# Patient Record
Sex: Male | Born: 2000 | Race: White | Hispanic: No | Marital: Single | Smoking: Current some day smoker
Health system: Southern US, Community
[De-identification: ages and names within clinical notes are randomized; demographics above are authoritative.]

---
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
## 2019-03-27 ENCOUNTER — Emergency Department (INDEPENDENT_AMBULATORY_CARE_PROVIDER_SITE_OTHER)
Admission: EM | Admit: 2019-03-27 | Discharge: 2019-03-27 | Disposition: A | Discharge status: Home / Self Care | Payer: BLUE CROSS/BLUE SHIELD | Source: Home / Self Care

## 2019-03-27 ENCOUNTER — Other Ambulatory Visit: Payer: Self-pay

## 2019-03-27 DIAGNOSIS — J029 Acute pharyngitis, unspecified: Secondary | ICD-10-CM

## 2019-03-27 DIAGNOSIS — R112 Nausea with vomiting, unspecified: Secondary | ICD-10-CM

## 2019-03-27 DIAGNOSIS — R6883 Chills (without fever): Secondary | ICD-10-CM

## 2019-03-27 DIAGNOSIS — Z20828 Contact with and (suspected) exposure to other viral communicable diseases: Secondary | ICD-10-CM

## 2019-03-27 DIAGNOSIS — J039 Acute tonsillitis, unspecified: Secondary | ICD-10-CM

## 2019-03-27 DIAGNOSIS — Z20822 Contact with and (suspected) exposure to covid-19: Secondary | ICD-10-CM

## 2019-03-27 LAB — POCT RAPID STREP A (OFFICE): Rapid Strep A Screen: NEGATIVE

## 2019-03-27 LAB — POCT INFLUENZA A/B
Influenza A, POC: NEGATIVE
Influenza B, POC: NEGATIVE

## 2019-03-27 MED ORDER — AMOXICILLIN 500 MG PO CAPS: 500.0000 mg | ORAL_CAPSULE | Freq: Two times a day (BID) | ORAL | 0 refills | Status: AC

## 2019-03-27 MED ORDER — ONDANSETRON 4 MG PO TBDP: ORAL_TABLET | ORAL | 0 refills | Status: AC

## 2019-03-27 NOTE — Discharge Instructions (Signed)
°  Please try to isolate yourself at home until the results of your Covid-19 test come back in 2-4 days.    In the meantime, please start the amoxicillin antibiotic as your symptoms appear to be due to strep throat ( a bacterial infection). A strep culture for more testing has been sent out for further evaluation.  You may take 500mg  acetaminophen every 4-6 hours or in combination with ibuprofen 400-600mg  every 6-8 hours as needed for pain, inflammation, and fever.  Be sure to well hydrated with clear liquids and get at least 8 hours of sleep at night, preferably more while sick.   Please follow up with family medicine in 1 week if needed.   Call 911 or go to the hospital if symptoms worsen- difficulty breathing, unable to keep down fluids or other new concerning symptoms develop.

## 2019-03-27 NOTE — ED Triage Notes (Signed)
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  Pt has been wearing PPE.  For the last 3 days patient has felt fatigue, sore throat, chills, fever, headache, and vomited this am.

## 2019-03-27 NOTE — ED Provider Notes (Signed)
[facility]    CSN: [id number] Arrival date & time: 03/27/19  1517     History   Chief Complaint Chief Complaint  Patient presents with  . Sore Throat  . Fever  . Emesis  . Headache    HPI Oscar Long is a 18 y.o. male.   HPI  Oscar Long is a 18 y.o. male presenting to UC with c/o 3 days of worsening sore throat, HA, chills, body aches, low-grade fever, nausea, and one episode of vomiting this morning. 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 He has been wearing PPE but he is concerned he may have still contracted the new virus.  Denies chest pain or SOB. He has not taken any medication for his symptoms. He notes he had been tested weekly at work and had been testing negative but was not tested this week because he was out sick.     History reviewed. No pertinent past medical history.  Patient Active Problem List   Diagnosis Date Noted  . BRONCHITIS, ACUTE 05/12/2009    History reviewed. No pertinent surgical history.     Home Medications    Prior to Admission medications   Medication Sig Start Date End Date Taking? Authorizing Provider  amoxicillin (AMOXIL) 500 MG capsule Take 1 capsule (500 mg total) by mouth 2 (two) times daily for 10 days. 03/27/19 04/06/19  Noe Gens, PA-C  ondansetron (ZOFRAN ODT) 4 MG disintegrating tablet 4mg  ODT every 4-8 hours prn nausea/vomit 03/27/19   Noe Gens, PA-C    Family History History reviewed. No pertinent family history.  Social History Social History   Tobacco Use  . Smoking status: Current Some Day Smoker  . Smokeless tobacco: Never Used  Substance Use Topics  . Alcohol use: Yes  . Drug use: Not Currently     Allergies   Patient has no known allergies.   Review of Systems Review of Systems  Constitutional: Positive for chills and fever.  HENT: Positive for sore throat. Negative for congestion, ear pain, trouble swallowing  and voice change.   Respiratory: Positive for cough. Negative for chest tightness and shortness of breath.   Cardiovascular: Negative for chest pain and palpitations.  Gastrointestinal: Positive for nausea and vomiting. Negative for abdominal pain and diarrhea.  Musculoskeletal: Negative for arthralgias, back pain and myalgias.  Skin: Negative for rash.  Neurological: Positive for headaches. Negative for dizziness and light-headedness.     Physical Exam Triage Vital Signs ED Triage Vitals  Enc Vitals Group     BP      Pulse      Resp      Temp      Temp src      SpO2      Weight      Height      Head Circumference      Peak Flow      Pain Score      Pain Loc      Pain Edu?      Excl. in [place]?    No data found.  Updated Vital Signs BP 127/83 (BP Location: Right Arm)   Pulse 96   Temp 99.4 F (37.4 C) (Oral)   Resp 20   Ht 5\' 8"  (1.727 m)   Wt 145 lb (65.8 kg)   SpO2 98%   BMI 22.05 kg/m   Visual Acuity Right Eye Distance:   Left  Eye Distance:   Bilateral Distance:    Right Eye Near:   Left Eye Near:    Bilateral Near:     Physical Exam Vitals signs and nursing note reviewed.  Constitutional:      Appearance: He is well-developed.  HENT:     Head: Normocephalic and atraumatic.     Right Ear: Tympanic membrane normal.     Left Ear: Tympanic membrane normal.     Nose: Nose normal.     Right Sinus: No maxillary sinus tenderness or frontal sinus tenderness.     Left Sinus: No maxillary sinus tenderness or frontal sinus tenderness.     Mouth/Throat:     Lips: Pink.     Mouth: Mucous membranes are moist.     Pharynx: Oropharyngeal exudate and posterior oropharyngeal erythema ( petichiae on roof of mouth and RIght side of pharynx) present. No uvula swelling.     Tonsils: Tonsillar exudate present. No tonsillar abscesses.  Neck:     Musculoskeletal: Normal range of motion and neck supple.  Cardiovascular:     Rate and Rhythm: Normal rate and regular rhythm.   Pulmonary:     Effort: Pulmonary effort is normal. No respiratory distress.     Breath sounds: Normal breath sounds. No stridor. No wheezing, rhonchi or rales.  Musculoskeletal: Normal range of motion.  Lymphadenopathy:     Cervical: Cervical adenopathy present.  Skin:    General: Skin is warm and dry.  Neurological:     Mental Status: He is alert and oriented to person, place, and time.  Psychiatric:        Behavior: Behavior normal.      UC Treatments / Results  Labs (all labs ordered are listed, but only abnormal results are displayed) Labs Reviewed  STREP A DNA PROBE  SARS-COV-2 RNA, QUALITATIVE REAL-TIME RT-PCR  POCT RAPID STREP A (OFFICE)  POCT INFLUENZA A/B    EKG None  Radiology No results found.  Procedures Procedures (including critical care time)  Medications Ordered in UC Medications - No data to display  Initial Impression / Assessment and Plan / UC Course  I have reviewed the triage vital signs and the nursing notes.  Pertinent labs & imaging results that were available during my care of the patient were reviewed by me and considered in my medical decision making (see chart for details).    Rapid flu: Negative Rapid strep: NEGATIVE, however, exam c/w strep pharyngitis. Will start pt on amoxicillin while strep culture pending.  Due to known exposure to Covid-19 and symptoms c/w the new virus, will also test for Covid-19  Home care info and work note provided. Work note provided until Tuesday of next week, 04/02/2019 to allow time for Covid results to come back. New work note can be given pending results.    Final Clinical Impressions(s) / UC Diagnoses   Final diagnoses:  Sore throat  Chills  Nausea and vomiting in adult  Exposure to Covid-19 Virus  Exudative tonsillitis     Discharge Instructions      Please try to isolate yourself at home until the results of your Covid-19 test come back in 2-4 days.    In the meantime, please start  the amoxicillin antibiotic as your symptoms appear to be due to strep throat ( a bacterial infection). A strep culture for more testing has been sent out for further evaluation.  You may take 500mg  acetaminophen every 4-6 hours or in combination with ibuprofen 400-600mg  every 6-8 hours  as needed for pain, inflammation, and fever.  Be sure to well hydrated with clear liquids and get at least 8 hours of sleep at night, preferably more while sick.   Please follow up with family medicine in 1 week if needed.   Call 911 or go to the hospital if symptoms worsen- difficulty breathing, unable to keep down fluids or other new concerning symptoms develop.     ED Prescriptions    Medication Sig Dispense Auth. Provider   amoxicillin (AMOXIL) 500 MG capsule Take 1 capsule (500 mg total) by mouth 2 (two) times daily for 10 days. 20 capsule Gerarda Fraction, [place] O, PA-C   ondansetron (ZOFRAN ODT) 4 MG disintegrating tablet 4mg  ODT every 4-8 hours prn nausea/vomit 8 tablet Noe Gens, PA-C     Controlled Substance Prescriptions [place] Controlled Substance Registry consulted? Not Applicable   Tyrell Antonio 03/27/19 8756

## 2019-03-28 LAB — STREP A DNA PROBE: Group A Strep Probe: NOT DETECTED

## 2019-03-29 LAB — SARS-COV-2 RNA,(COVID-19) QUALITATIVE NAAT: SARS CoV2 RNA: NOT DETECTED

## 2019-03-30 ENCOUNTER — Telehealth: Payer: Self-pay

## 2019-03-30 NOTE — Telephone Encounter (Signed)
VM not set up.

## 2019-03-31 NOTE — Telephone Encounter (Signed)
Pt states hes feeling better after UC visit. Given neg lab results. Requested to pick up results for work. Told to bring ID when picking up.

## 2019-11-28 ENCOUNTER — Other Ambulatory Visit: Payer: Self-pay

## 2019-11-28 ENCOUNTER — Emergency Department (INDEPENDENT_AMBULATORY_CARE_PROVIDER_SITE_OTHER)
Admission: EM | Admit: 2019-11-28 | Discharge: 2019-11-28 | Disposition: A | Discharge status: Home / Self Care | Payer: Self-pay | Source: Home / Self Care | Attending: Family Medicine | Admitting: Family Medicine

## 2019-11-28 DIAGNOSIS — S71112A Laceration without foreign body, left thigh, initial encounter: Secondary | ICD-10-CM

## 2019-11-28 NOTE — ED Triage Notes (Signed)
Skateboarding, and slid down a nail.  Happened about 1 hour ago.Has a gaping, jagged cut to the left upper thigh.

## 2019-11-28 NOTE — Discharge Instructions (Addendum)
Change dressing daily (use non-stick bandage such as Telfa) and apply Bacitracin ointment to wound.  Keep wound clean and dry.  Return for any signs of infection (or follow-up with family doctor):  Increasing redness, swelling, pain, heat, drainage, etc. Return in 10 days for suture removal.

## 2019-11-28 NOTE — ED Provider Notes (Signed)
[facility]    CSN: [id number] Arrival date & time: 11/28/19  1409      History   Chief Complaint Chief Complaint  Patient presents with  . Laceration    thigh    HPI [facility] is a 19 y.o. male.   While skateboarding over a ramp about 2 hours ago patient fell, sliding down the ramp.  A protruding nail lacerated his left thigh.  He reports that his last Tdap was within the past two years.  The history is provided by the patient.  Laceration Location: left thigh. Length:  2.5cm Depth:  Through underlying tissue Quality: avulsion   Bleeding: controlled   Time since incident:  2 hours Laceration mechanism:  Nail Pain details:    Quality:  Aching   Severity:  Mild   Timing:  Constant   Progression:  Unchanged Foreign body present:  No foreign bodies Relieved by:  None tried Worsened by:  Nothing Ineffective treatments:  None tried Tetanus status:  Up to date Associated symptoms: no swelling     History reviewed. No pertinent past medical history.  Patient Active Problem List   Diagnosis Date Noted  . BRONCHITIS, ACUTE 05/12/2009    History reviewed. No pertinent surgical history.     Home Medications    Prior to Admission medications   Medication Sig Start Date End Date Taking? Authorizing Provider  FLUoxetine (PROZAC) 40 MG capsule Take by mouth. 01/08/18  Yes [provider]  ondansetron (ZOFRAN ODT) 4 MG disintegrating tablet 4mg  ODT every 4-8 hours prn nausea/vomit 03/27/19   Noe Gens, PA-C    Family History Family History  Problem Relation Age of Onset  . Osteoporosis Mother     Social History Social History   Tobacco Use  . Smoking status: Current Some Day Smoker  . Smokeless tobacco: Never Used  Substance Use Topics  . Alcohol use: Yes  . Drug use: Not Currently     Allergies   Patient has no known allergies.   Review of Systems Review of Systems  All other systems reviewed and are  negative.    Physical Exam Triage Vital Signs ED Triage Vitals [11/28/19 1500]  Enc Vitals Group     BP 129/82     Pulse Rate 93     Resp 18     Temp 98.2 F (36.8 C)     Temp Source Oral     SpO2 98 %     Weight 154 lb (69.9 kg)     Height 5\' 7"  (1.702 m)     Head Circumference      Peak Flow      Pain Score 6     Pain Loc      Pain Edu?      Excl. in [place]?    No data found.  Updated Vital Signs BP 129/82 (BP Location: Right Arm)   Pulse 93   Temp 98.2 F (36.8 C) (Oral)   Resp 18   Ht 5\' 7"  (1.702 m)   Wt 69.9 kg   SpO2 98%   BMI 24.12 kg/m   Visual Acuity Right Eye Distance:   Left Eye Distance:   Bilateral Distance:    Right Eye Near:   Left Eye Near:    Bilateral Near:     Physical Exam Vitals and nursing note reviewed.  Constitutional:      General: He is not in acute distress. HENT:     Head: Atraumatic.  Eyes:     Pupils: Pupils are equal, round, and reactive to light.  Cardiovascular:     Rate and Rhythm: Normal rate.  Pulmonary:     Effort: Pulmonary effort is normal.  Musculoskeletal:     Left upper leg: Laceration present.       Legs:     Comments: Left lateral thigh has a 2.5cm flap laceration as noted on diagram.  Beneath and adjacent to the flap laceration is a 1.5cm diameter abrasion.  Skin:    General: Skin is warm and dry.  Neurological:     Mental Status: He is alert.      UC Treatments / Results  Labs (all labs ordered are listed, but only abnormal results are displayed) Labs Reviewed - No data to display  EKG   Radiology No results found.  Procedures Procedures  Laceration Repair Discussed benefits and risks of procedure and verbal consent obtained. Using sterile technique and local anesthesia with 1% lidocaine with epinephrine, cleansed wound with Betadine followed by copious lavage with normal saline.  Wound carefully inspected for debris and foreign bodies; none found.  Wound closed with #6, 4-0 interrupted  nylon sutures.  Bacitracin applied. Wound dressed with Mepelex Border (7.5cm) bandage and taped in place.  Wound precautions explained to patient.  Return for suture removal in 10 days.   Medications Ordered in UC Medications - No data to display  Initial Impression / Assessment and Plan / UC Course  I have reviewed the triage vital signs and the nursing notes.  Pertinent labs & imaging results that were available during my care of the patient were reviewed by me and considered in my medical decision making (see chart for details).    Patient advised to remove the Mepelex dressing in 24 hours, and then begin using Telfa dressings daily.  Final Clinical Impressions(s) / UC Diagnoses   Final diagnoses:  Laceration of skin of left thigh, initial encounter     Discharge Instructions     Change dressing daily (use non-stick bandage such as Telfa) and apply Bacitracin ointment to wound.  Keep wound clean and dry.  Return for any signs of infection (or follow-up with family doctor):  Increasing redness, swelling, pain, heat, drainage, etc. Return in 10 days for suture removal.      ED Prescriptions    None     PDMP not reviewed this encounter.   Kandra Nicolas, MD 11/29/19 [phone]

## 2020-02-12 DIAGNOSIS — F1220 Cannabis dependence, uncomplicated: Secondary | ICD-10-CM | POA: Insufficient documentation

## 2020-02-18 ENCOUNTER — Telehealth (HOSPITAL_COMMUNITY): Payer: Self-pay | Admitting: Professional

## 2020-02-18 ENCOUNTER — Other Ambulatory Visit: Payer: Self-pay

## 2020-02-18 ENCOUNTER — Other Ambulatory Visit (HOSPITAL_COMMUNITY): Payer: BC Managed Care – PPO | Attending: Psychiatry

## 2020-09-21 ENCOUNTER — Emergency Department (INDEPENDENT_AMBULATORY_CARE_PROVIDER_SITE_OTHER): Payer: Self-pay

## 2020-09-21 ENCOUNTER — Encounter: Payer: Self-pay | Admitting: Emergency Medicine

## 2020-09-21 ENCOUNTER — Emergency Department (INDEPENDENT_AMBULATORY_CARE_PROVIDER_SITE_OTHER)
Admission: EM | Admit: 2020-09-21 | Discharge: 2020-09-21 | Disposition: A | Discharge status: Home / Self Care | Payer: Self-pay | Source: Home / Self Care

## 2020-09-21 DIAGNOSIS — M25471 Effusion, right ankle: Secondary | ICD-10-CM

## 2020-09-21 DIAGNOSIS — S93401A Sprain of unspecified ligament of right ankle, initial encounter: Secondary | ICD-10-CM

## 2020-09-21 DIAGNOSIS — G8929 Other chronic pain: Secondary | ICD-10-CM

## 2020-09-21 DIAGNOSIS — M25571 Pain in right ankle and joints of right foot: Secondary | ICD-10-CM

## 2020-09-21 DIAGNOSIS — R29898 Other symptoms and signs involving the musculoskeletal system: Secondary | ICD-10-CM

## 2020-09-21 NOTE — ED Triage Notes (Signed)
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

## 2020-09-21 NOTE — Discharge Instructions (Addendum)
  You may use the splint for comfort and support. Call to schedule a follow up appointment with Sports Medicine. You may benefit from physical therapy to help strengthen your ankle.

## 2020-09-21 NOTE — ED Provider Notes (Signed)
[facility]    CSN: [id number] Arrival date & time: 09/21/20  1417      History   Chief Complaint Chief Complaint  Patient presents with  . Ankle Injury    HPI Oscar Long is a 19 y.o. male.   HPI Oscar Long is a 19 y.o. male presenting to UC with c/o Right ankle pain and swelling that started 3 days ago after rolling his ankle skateboarding. Hx of Right ankle fracture and surgery in the past but states no hardware was placed.  Ankle stays swollen.  Pain is 4/10, worse with ambulation.    History reviewed. No pertinent past medical history.  Patient Active Problem List   Diagnosis Date Noted  . Cannabis use disorder, severe, dependence ([place]) 02/12/2020  . Major depressive disorder with single episode, in full remission ([place]) 10/05/2018  . Attention deficit hyperactivity disorder (ADHD) 07/19/2018  . Suicide attempt by inadequate means ([place]) 11/27/2017  . Osteochondromatosis 01/31/2016  . Eczema 07/26/2011  . BRONCHITIS, ACUTE 05/12/2009    History reviewed. No pertinent surgical history.     Home Medications    Prior to Admission medications   Medication Sig Start Date End Date Taking? Authorizing Provider  FLUoxetine (PROZAC) 40 MG capsule Take by mouth. Patient not taking: Reported on 09/21/2020 01/08/18   [provider]  ondansetron (ZOFRAN ODT) 4 MG disintegrating tablet 4mg  ODT every 4-8 hours prn nausea/vomit Patient not taking: Reported on 09/21/2020 03/27/19   Noe Gens, PA-C    Family History Family History  Problem Relation Age of Onset  . Osteoporosis Mother   . Healthy Father   . Healthy Sister   . Healthy Brother   . Healthy Sister     Social History Social History   Tobacco Use  . Smoking status: Current Some Day Smoker  . Smokeless tobacco: Never Used  Vaping Use  . Vaping Use: Every day  . Substances: Nicotine  Substance Use Topics  . Alcohol use: Not Currently  . Drug use: Yes    Frequency: 1.0 times  per week    Types: Marijuana     Allergies   Diphenhydramine hcl   Review of Systems Review of Systems  Musculoskeletal: Positive for arthralgias and joint swelling.  Skin: Negative for color change and wound.     Physical Exam Triage Vital Signs ED Triage Vitals  Enc Vitals Group     BP 09/21/20 1436 125/68     Pulse Rate 09/21/20 1436 61     Resp 09/21/20 1436 15     Temp 09/21/20 1436 98.1 F (36.7 C)     Temp Source 09/21/20 1436 Oral     SpO2 09/21/20 1436 99 %     Weight 09/21/20 1443 153 lb (69.4 kg)     Height 09/21/20 1443 5\' 7"  (1.702 m)     Head Circumference --      Peak Flow --      Pain Score 09/21/20 1442 4     Pain Loc --      Pain Edu? --      Excl. in [place]? --    No data found.  Updated Vital Signs BP 125/68 (BP Location: Right Arm)   Pulse 61   Temp 98.1 F (36.7 C) (Oral)   Resp 15   Ht 5\' 7"  (1.702 m)   Wt 153 lb (69.4 kg)   SpO2 99%   BMI 23.96 kg/m   Visual Acuity Right Eye Distance:  Left Eye Distance:   Bilateral Distance:    Right Eye Near:   Left Eye Near:    Bilateral Near:     Physical Exam Vitals and nursing note reviewed.  Constitutional:      Appearance: Normal appearance. He is well-developed.  HENT:     Head: Normocephalic and atraumatic.  Cardiovascular:     Rate and Rhythm: Normal rate and regular rhythm.     Pulses:          Dorsalis pedis pulses are 2+ on the right side.       Posterior tibial pulses are 2+ on the right side.  Pulmonary:     Effort: Pulmonary effort is normal.  Musculoskeletal:        General: Swelling and tenderness present. Normal range of motion.     Cervical back: Normal range of motion.     Comments: Right ankle: moderate edema. Tenderness to medial aspect. Full ROM, increased pain with movement.  No tenderness to foot or calf.   Skin:    General: Skin is warm and dry.     Capillary Refill: Capillary refill takes less than 2 seconds.     Findings: No bruising or erythema.   Neurological:     Mental Status: He is alert and oriented to person, place, and time.  Psychiatric:        Behavior: Behavior normal.      UC Treatments / Results  Labs (all labs ordered are listed, but only abnormal results are displayed) Labs Reviewed - No data to display  EKG   Radiology DG Ankle Complete Right  Result Date: 09/21/2020 CLINICAL DATA:  Chronic right ankle pain and swelling after injury 3 days ago. History states right ankle sx 3 years ago. EXAM: RIGHT ANKLE - COMPLETE 3+ VIEW COMPARISON:  None. FINDINGS: Examination demonstrates moderate bony remodeling over the distal tibia and fibular diametaphyseal regions likely due to old fracture sites. Ankle mortise is within normal. No acute fracture or dislocation. IMPRESSION: 1. No acute findings. 2. Moderate bony remodeling over the distal tibia and fibula likely due to old fracture sites. Electronically Signed   By: Marin Olp M.D.   On: 09/21/2020 15:33    Procedures Procedures (including critical care time)  Medications Ordered in UC Medications - No data to display  Initial Impression / Assessment and Plan / UC Course  I have reviewed the triage vital signs and the nursing notes.  Pertinent labs & imaging results that were available during my care of the patient were reviewed by me and considered in my medical decision making (see chart for details).    Discussed imaging with pt Ankle splint provided for comfort and support F/u with Sports Medicine AVS given  Final Clinical Impressions(s) / UC Diagnoses   Final diagnoses:  Pain and swelling of right ankle  Chronic pain of right ankle  Ankle weakness  Sprain of right ankle, unspecified ligament, initial encounter     Discharge Instructions      You may use the splint for comfort and support. Call to schedule a follow up appointment with Sports Medicine. You may benefit from physical therapy to help strengthen your ankle.     ED  Prescriptions    None     PDMP not reviewed this encounter.   Noe Gens, [place] 09/21/20 1702

## 2022-02-23 IMAGING — DX DG ANKLE COMPLETE 3+V*R*
3 series · 3 of 3 positions shown · non-contrast
Comparison: None.

CLINICAL DATA: Chronic right ankle pain and swelling after injury 3
days ago. History states right ankle sx 3 years ago.

EXAM:
RIGHT ANKLE - COMPLETE 3+ VIEW

[ankle ap]
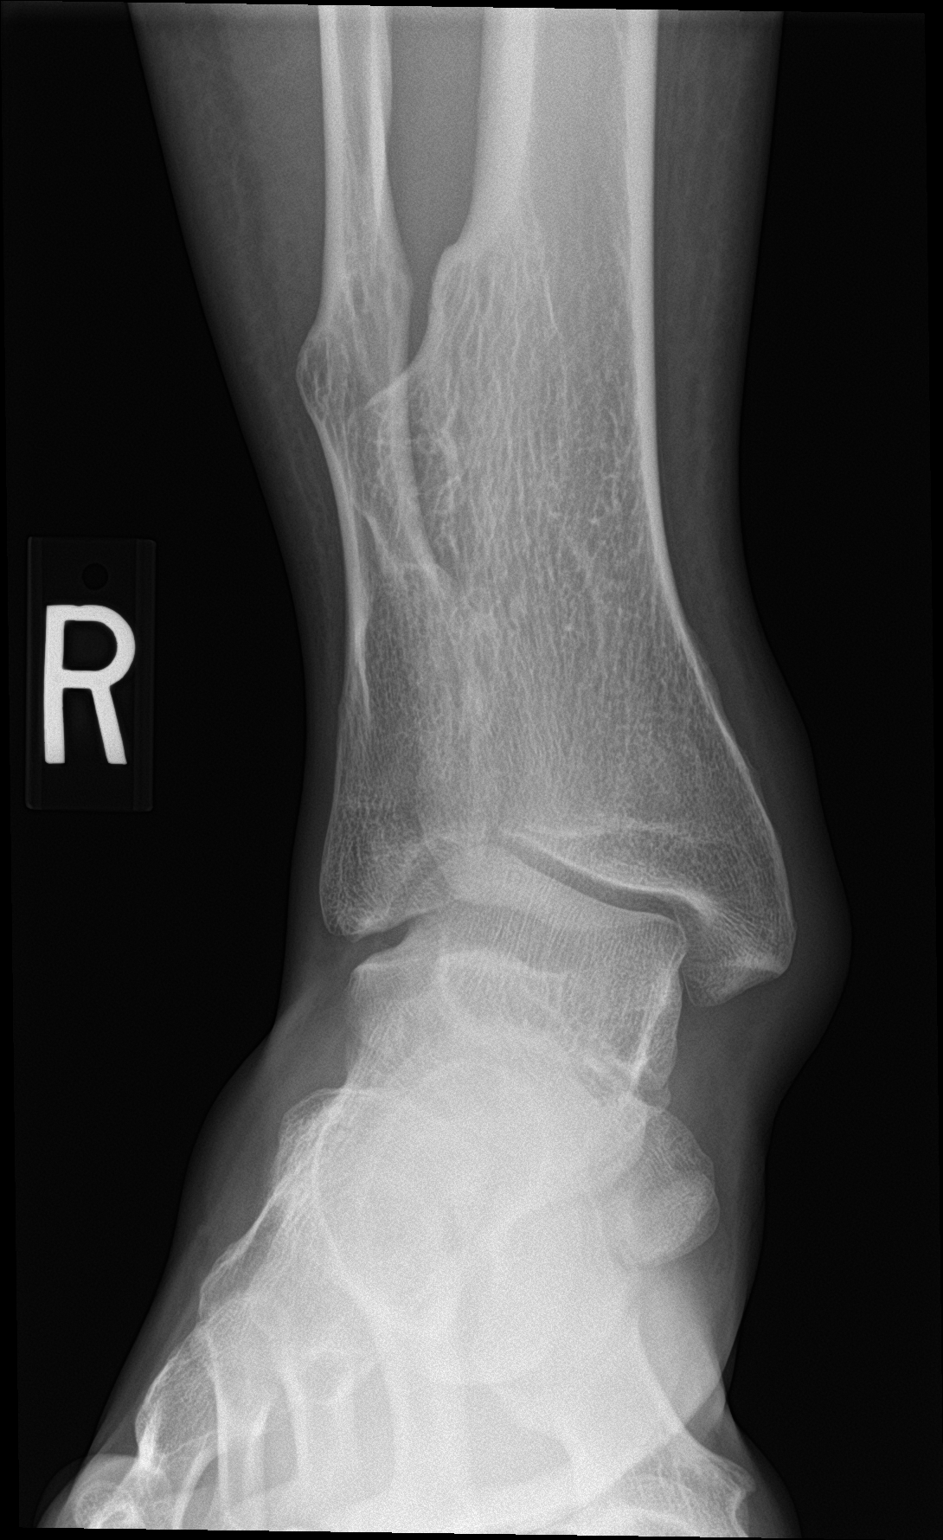

[ankle obl]
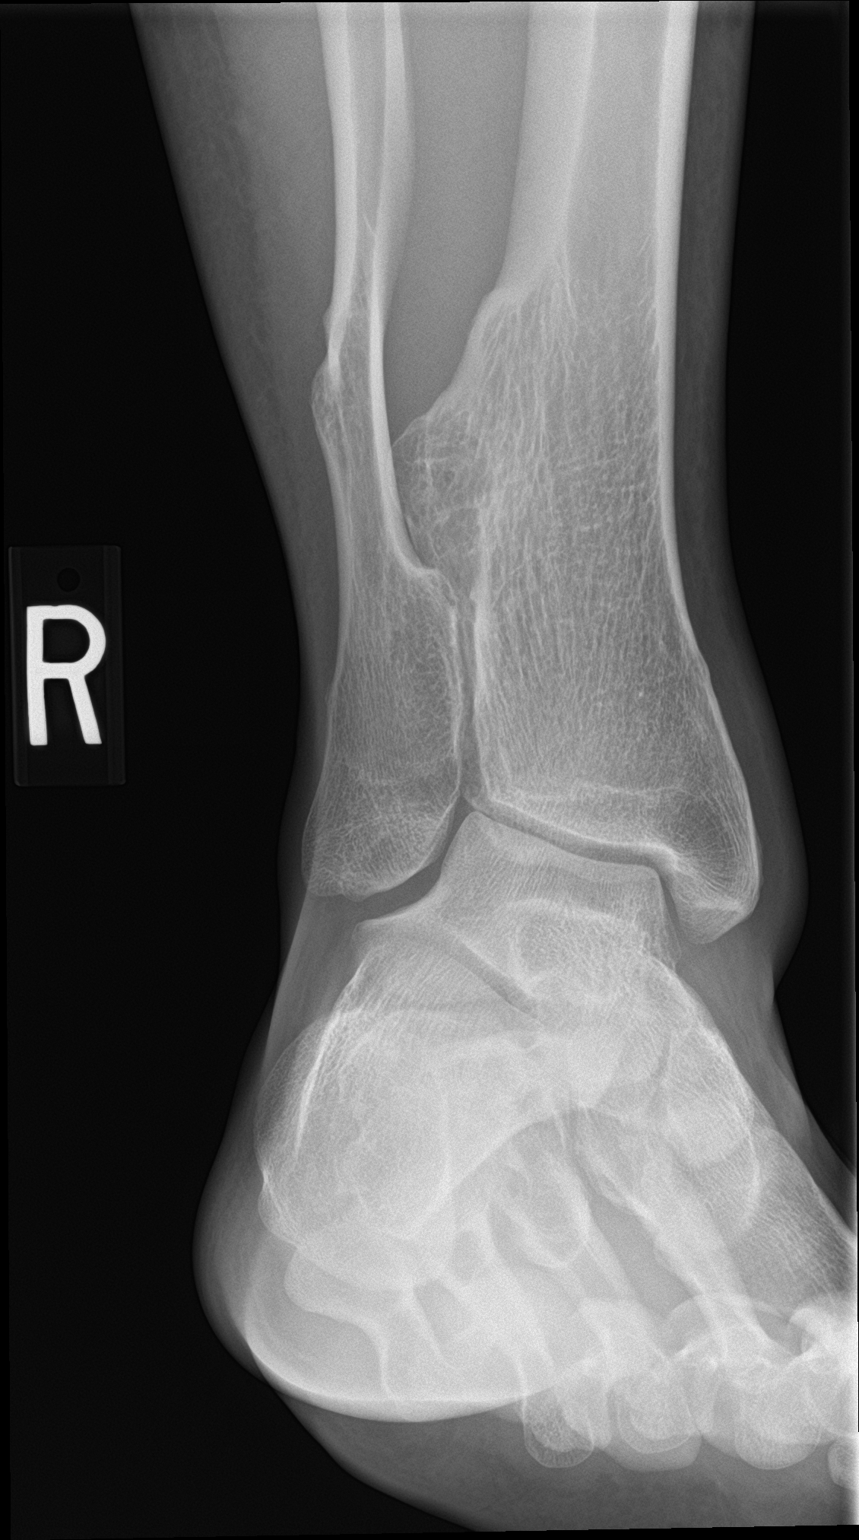

[ankle lat]
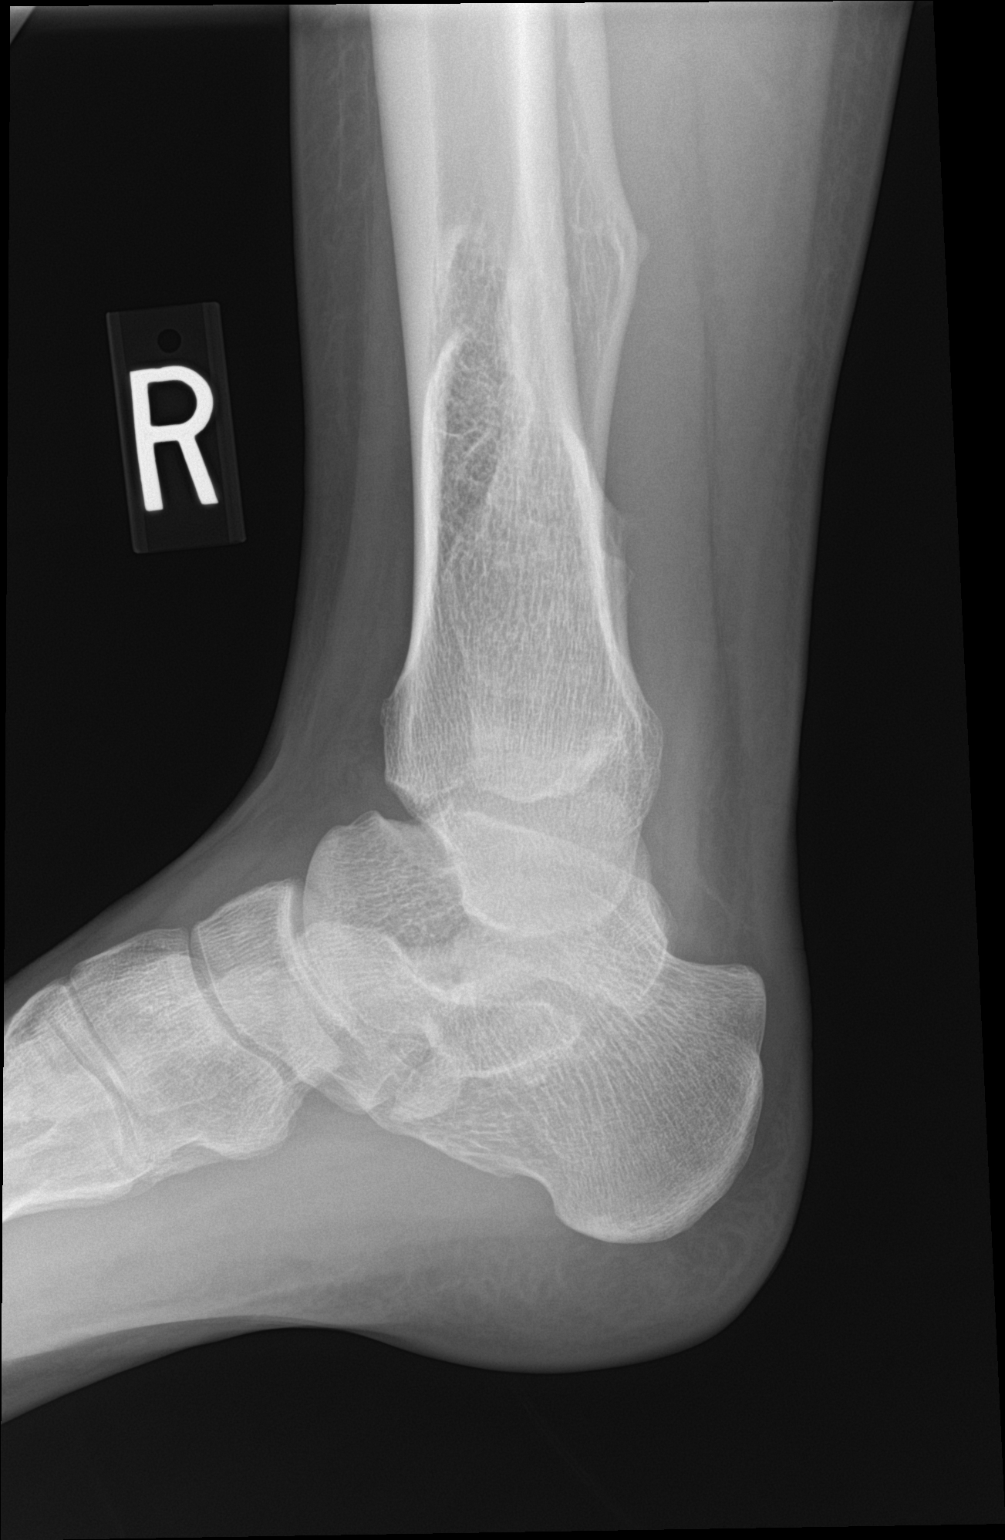

[3 of 3 positions shown; findings below may reference images not displayed]

FINDINGS: Examination demonstrates moderate bony remodeling over the distal
tibia and fibular diametaphyseal regions likely due to old fracture
sites. Ankle mortise is within normal. No acute fracture or
dislocation.
IMPRESSION: 1. No acute findings.
2. Moderate bony remodeling over the distal tibia and fibula likely
due to old fracture sites.

## 2023-07-28 ENCOUNTER — Encounter (HOSPITAL_COMMUNITY): Payer: Self-pay

## 2023-07-28 ENCOUNTER — Ambulatory Visit (HOSPITAL_COMMUNITY)
Admission: EM | Admit: 2023-07-28 | Discharge: 2023-07-28 | Disposition: A | Discharge status: Home / Self Care | Payer: BC Managed Care – PPO | Attending: Physician Assistant | Admitting: Physician Assistant

## 2023-07-28 DIAGNOSIS — Z203 Contact with and (suspected) exposure to rabies: Secondary | ICD-10-CM

## 2023-07-28 DIAGNOSIS — Z2914 Encounter for prophylactic rabies immune globin: Secondary | ICD-10-CM | POA: Diagnosis not present

## 2023-07-28 DIAGNOSIS — S81802A Unspecified open wound, left lower leg, initial encounter: Secondary | ICD-10-CM | POA: Diagnosis not present

## 2023-07-28 DIAGNOSIS — W540XXA Bitten by dog, initial encounter: Secondary | ICD-10-CM

## 2023-07-28 MED ORDER — TETANUS-DIPHTH-ACELL PERTUSSIS 5-2.5-18.5 LF-MCG/0.5 IM SUSY
PREFILLED_SYRINGE | INTRAMUSCULAR | Status: AC
  Filled 2023-07-28: qty 0.5

## 2023-07-28 MED ORDER — IBUPROFEN 800 MG PO TABS
800.0000 mg | ORAL_TABLET | Freq: Three times a day (TID) | ORAL | 0 refills | Status: AC
Start: 2023-07-28 — End: ?

## 2023-07-28 MED ORDER — RABIES IMMUNE GLOBULIN 150 UNIT/ML IM INJ
20.0000 [IU]/kg | INJECTION | Freq: Once | INTRAMUSCULAR | Status: AC
  Administered 2023-07-28: 1350 [IU]

## 2023-07-28 MED ORDER — RABIES IMMUNE GLOBULIN 150 UNIT/ML IM INJ
INJECTION | INTRAMUSCULAR | Status: AC
  Filled 2023-07-28: qty 10

## 2023-07-28 MED ORDER — MUPIROCIN 2 % EX OINT: 1.0000 | TOPICAL_OINTMENT | Freq: Two times a day (BID) | CUTANEOUS | 0 refills | Status: AC

## 2023-07-28 MED ORDER — TETANUS-DIPHTH-ACELL PERTUSSIS 5-2.5-18.5 LF-MCG/0.5 IM SUSY
0.5000 mL | PREFILLED_SYRINGE | Freq: Once | INTRAMUSCULAR | Status: AC
  Administered 2023-07-28: 0.5 mL via INTRAMUSCULAR

## 2023-07-28 MED ORDER — RABIES VACCINE, PCEC IM SUSR
INTRAMUSCULAR | Status: AC
  Filled 2023-07-28: qty 1

## 2023-07-28 MED ORDER — RABIES VACCINE, PCEC IM SUSR
1.0000 mL | Freq: Once | INTRAMUSCULAR | Status: AC
  Administered 2023-07-28: 1 mL via INTRAMUSCULAR

## 2023-07-28 MED ORDER — AMOXICILLIN-POT CLAVULANATE 875-125 MG PO TABS
1.0000 | ORAL_TABLET | Freq: Two times a day (BID) | ORAL | 0 refills | Status: AC
Start: 2023-07-28 — End: ?

## 2023-07-28 NOTE — ED Provider Notes (Signed)
[facility]    CSN: [id number] Arrival date & time: 07/28/23  1832      History   Chief Complaint Chief Complaint  Patient presents with   Animal Bite    HPI [facility] is a 22 y.o. male.   Patient presents today for evaluation of dog bite on his left leg.  Reports that he was at a skate park when a dog that was not accompanied by a human ran up to him.  Originally the dog was barking at him but then attacked his lower leg.  He received 1 significant bite but then it did also rip his pants.  The dog was wearing a collar but they do not know who it belonged to and it ran off before they were able to see where it lived.  Patient has never had rabies vaccination or postexposure prophylaxis before.  He is unsure when his last tetanus was.  Denies any recent antibiotics.  He is able to bear weight.  Denies any numbness or paresthesias in the foot.    History reviewed. No pertinent past medical history.  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 No pertinent surgical history.     Home Medications    Prior to Admission medications   Medication Sig Start Date End Date Taking? Authorizing Provider  amoxicillin-clavulanate (AUGMENTIN) 875-125 MG tablet Take 1 tablet by mouth every 12 (twelve) hours. 07/28/23  Yes Maryrose Colvin K, PA-C  ibuprofen (ADVIL) 800 MG tablet Take 1 tablet (800 mg total) by mouth 3 (three) times daily. 07/28/23  Yes Rosamund Nyland, Denny Peon K, PA-C  mupirocin ointment (BACTROBAN) 2 % Apply 1 Application topically 2 (two) times daily. 07/28/23  Yes Glenis Musolf K, PA-C  FLUoxetine (PROZAC)  40 MG capsule Take by mouth. Patient not taking: Reported on 09/21/2020 01/08/18   [provider]  ondansetron (ZOFRAN ODT) 4 MG disintegrating tablet 4mg  ODT every 4-8 hours prn nausea/vomit Patient not taking: Reported on 09/21/2020 03/27/19   Lurene Shadow, PA-C    Family History Family History  Problem Relation Age of Onset   Osteoporosis Mother    Healthy Father    Healthy Sister    Healthy Brother    Healthy Sister     Social History Social History   Tobacco Use   Smoking status: Some Days   Smokeless tobacco: Never  Vaping Use   Vaping status: Every Day   Substances: Nicotine  Substance Use Topics   Alcohol use: Not Currently   Drug use: Yes    Frequency: 1.0 times per week    Types: Marijuana     Allergies   Diphenhydramine hcl   Review of Systems Review of Systems  Constitutional:  Positive for activity change. Negative for appetite change, fatigue and fever.  Musculoskeletal:  Positive for arthralgias. Negative for myalgias.  Skin:  Positive for wound. Negative for color change.     Physical Exam Triage Vital Signs ED Triage Vitals  Encounter Vitals Group     BP 07/28/23 1936 (!) 118/104     Systolic BP Percentile --      Diastolic BP Percentile --  Pulse Rate 07/28/23 1936 (!) 122     Resp 07/28/23 1936 20     Temp 07/28/23 1941 98.5 F (36.9 C)     Temp Source 07/28/23 1936 Oral     SpO2 07/28/23 1936 100 %     Weight 07/28/23 1944 145 lb (65.8 kg)     Height --      Head Circumference --      Peak Flow --      Pain Score --      Pain Loc --      Pain Education --      Exclude from Growth Chart --    No data found.  Updated Vital Signs BP 112/67 (BP Location: Right Arm)   Pulse 91   Temp 98.5 F (36.9 C) (Oral)   Resp 18   Wt 145 lb (65.8 kg)   SpO2 98%   BMI 22.71 kg/m   Visual Acuity Right Eye Distance:   Left Eye Distance:   Bilateral Distance:    Right Eye Near:   Left Eye Near:    Bilateral Near:      Physical Exam Vitals reviewed.  Constitutional:      General: He is awake.     Appearance: Normal appearance. He is well-developed. He is not ill-appearing.     Comments: Very pleasant male appears stated age in no acute distress sitting comfortably in exam room  HENT:     Head: Normocephalic and atraumatic.     Mouth/Throat:     Pharynx: No oropharyngeal exudate, posterior oropharyngeal erythema or uvula swelling.  Cardiovascular:     Rate and Rhythm: Normal rate and regular rhythm.     Heart sounds: Normal heart sounds, S1 normal and S2 normal. No murmur heard. Pulmonary:     Effort: Pulmonary effort is normal.     Breath sounds: Normal breath sounds. No stridor. No wheezing, rhonchi or rales.     Comments: Clear to auscultation bilaterally Musculoskeletal:     Comments: Normal active range of motion at foot and ankle.  Achilles intact.  Skin:    Findings: Wound present.       Neurological:     Mental Status: He is alert.  Psychiatric:        Behavior: Behavior is cooperative.      UC Treatments / Results  Labs (all labs ordered are listed, but only abnormal results are displayed) Labs Reviewed - No data to display  EKG   Radiology No results found.  Procedures Procedures (including critical care time)  Medications Ordered in UC Medications  rabies immune globulin (HYPERRAB/KEDRAB) injection 1,350 Units (1,350 Units Infiltration Given by Other 07/28/23 2018)  rabies vaccine (RABAVERT) injection 1 mL (1 mL Intramuscular Given 07/28/23 2023)  Tdap (BOOSTRIX) injection 0.5 mL (0.5 mLs Intramuscular Given 07/28/23 2023)    Initial Impression / Assessment and Plan / UC Course  I have reviewed the triage vital signs and the nursing notes.  Pertinent labs & imaging results that were available during my care of the patient were reviewed by me and considered in my medical decision making (see chart for details).     Patient was initially tachycardic but also very  upset.  After he was able to sit for several minutes after his wound was managed his heart rate and other vitals normalized.  Wound was irrigated with normal saline and cleaned.  Will allow this to heal by secondary intention given it is a dog bite.  He  was encouraged to keep area clean and apply Bactroban ointment.  Will treat with Augmentin and he was instructed to take this twice daily for 10 days.  Rabies immunoglobulin was infiltrated by provider and patient tolerated this procedure well.  He was given his initial rabies vaccination as well as a Tdap.  We discussed the importance of returning for the rest of the rabies vaccination series and he was given specific dates he is to return to complete this course of treatment.  We discussed that infection is the greatest risk and he needs to keep this area very clean.  If he has any concerning symptoms he needs to return for reevaluation.  He was given ibuprofen for pain relief.  We discussed that he should not take additional NSAIDs with this medication.  Strict return precautions given.  Work excuse note provided.  All questions answered to patient satisfaction.  Final Clinical Impressions(s) / UC Diagnoses   Final diagnoses:  Wound of left lower extremity, initial encounter  Dog bite, initial encounter  Encounter for prophylactic administration of rabies immune globulin     Discharge Instructions       Meds ordered this encounter  Medications   rabies immune globulin (HYPERRAB/KEDRAB) injection 1,350 Units   rabies vaccine (RABAVERT) injection 1 mL   Tdap (BOOSTRIX) injection 0.5 mL   amoxicillin-clavulanate (AUGMENTIN) 875-125 MG tablet    Sig: Take 1 tablet by mouth every 12 (twelve) hours.    Dispense:  20 tablet    Refill:  0    Order Specific Question:   Supervising Provider    Answer:   Merrilee Jansky [[id number]]   mupirocin ointment (BACTROBAN) 2 %    Sig: Apply 1 Application topically 2 (two) times daily.    Dispense:  22 g     Refill:  0    Order Specific Question:   Supervising Provider    Answer:   Merrilee Jansky [id number]    In addition to the human rabies immune globulin (HRIG), you were given the first dose of the rabies vaccine today (Day 0).  Please return here on the following dates to complete the rabies series: Day 3: 07/31/2023 Day 7: 08/04/2023 Day 14: 08/11/2023  We injected immunoglobulin into the wound and started rabies vaccines.  You will need to return at days 3, 7, 14 for additional rabies vaccinations as listed above.  Start Augmentin twice daily for 10 days.  Keep this area very clean with soap and water and apply Bactroban ointment.  If there is any redness, swelling, abnormal drainage you need to be seen immediately.  Take ibuprofen for pain relief.  Do not take additional NSAIDs with this medication including aspirin, ibuprofen/Advil, naproxen/Aleve.  If you have any severe symptoms including swelling, nausea, vomiting, drainage from the wound, increasing pain, red streaking, numbness or tingling in your foot you need to be seen immediately.     ED Prescriptions     Medication Sig Dispense Auth. Provider   amoxicillin-clavulanate (AUGMENTIN) 875-125 MG tablet Take 1 tablet by mouth every 12 (twelve) hours. 20 tablet Lujuana Kapler K, PA-C   mupirocin ointment (BACTROBAN) 2 % Apply 1 Application topically 2 (two) times daily. 22 g Zafirah Vanzee K, PA-C   ibuprofen (ADVIL) 800 MG tablet Take 1 tablet (800 mg total) by mouth 3 (three) times daily. 21 tablet Jayshun Galentine, Noberto Retort, PA-C      PDMP not reviewed this encounter.   Jeani Hawking, Cordelia Poche 07/28/23 2117

## 2023-07-28 NOTE — ED Triage Notes (Signed)
Here for dog bite to left lower leg calf. Pt was working in the park this afternoon when a stray dog came up to him and bit his leg.

## 2023-07-28 NOTE — Discharge Instructions (Addendum)
Meds ordered this encounter  Medications   rabies immune globulin (HYPERRAB/KEDRAB) injection 1,350 Units   rabies vaccine (RABAVERT) injection 1 mL   Tdap (BOOSTRIX) injection 0.5 mL   amoxicillin-clavulanate (AUGMENTIN) 875-125 MG tablet    Sig: Take 1 tablet by mouth every 12 (twelve) hours.    Dispense:  20 tablet    Refill:  0    Order Specific Question:   Supervising Provider    Answer:   Merrilee Jansky [[id number]]   mupirocin ointment (BACTROBAN) 2 %    Sig: Apply 1 Application topically 2 (two) times daily.    Dispense:  22 g    Refill:  0    Order Specific Question:   Supervising Provider    Answer:   Merrilee Jansky [id number]    In addition to the human rabies immune globulin (HRIG), you were given the first dose of the rabies vaccine today (Day 0).  Please return here on the following dates to complete the rabies series: Day 3: 07/31/2023 Day 7: 08/04/2023 Day 14: 08/11/2023  We injected immunoglobulin into the wound and started rabies vaccines.  You will need to return at days 3, 7, 14 for additional rabies vaccinations as listed above.  Start Augmentin twice daily for 10 days.  Keep this area very clean with soap and water and apply Bactroban ointment.  If there is any redness, swelling, abnormal drainage you need to be seen immediately.  Take ibuprofen for pain relief.  Do not take additional NSAIDs with this medication including aspirin, ibuprofen/Advil, naproxen/Aleve.  If you have any severe symptoms including swelling, nausea, vomiting, drainage from the wound, increasing pain, red streaking, numbness or tingling in your foot you need to be seen immediately.

## 2023-07-29 ENCOUNTER — Ambulatory Visit
Admission: EM | Admit: 2023-07-29 | Discharge: 2023-07-29 | Disposition: A | Discharge status: Home / Self Care | Payer: BC Managed Care – PPO | Attending: Family Medicine | Admitting: Family Medicine

## 2023-07-29 ENCOUNTER — Other Ambulatory Visit: Payer: Self-pay

## 2023-07-29 DIAGNOSIS — W540XXD Bitten by dog, subsequent encounter: Secondary | ICD-10-CM

## 2023-07-29 DIAGNOSIS — S81832A Puncture wound without foreign body, left lower leg, initial encounter: Secondary | ICD-10-CM

## 2023-07-29 MED ORDER — HYDROCODONE-ACETAMINOPHEN 5-325 MG PO TABS
1.0000 | ORAL_TABLET | Freq: Three times a day (TID) | ORAL | 0 refills | Status: AC | PRN
Start: 2023-07-29 — End: ?

## 2023-07-29 NOTE — ED Triage Notes (Signed)
Patient states he was bitten by dog yesterday. Patient was seen yesterday and states he did started the rabies series and has not began the abx tx. The patient reports his LLE has a stinging senation, feels heavy and that the dressing is too tight. There is swelling to the area, dressing removed had moderate amount of dry bloody drainage, but wounds are not actively bleeding.

## 2023-07-29 NOTE — ED Provider Notes (Signed)
[facility]    CSN: [id number] Arrival date & time: 07/29/23  1110      History   Chief Complaint Chief Complaint  Patient presents with   Animal Bite    HPI Oscar Long is a 22 y.o. male.   HPI Pleasant 22 year old male presents with puncture wounds of left lower leg/dog bite that occurred yesterday, Friday, 07/28/2023.  Patient is accompanied by his Mother and his sister this morning.  PMH significant for ADHD, cannabis use disorder, severe, dependence, and suicide attempt by inadequate means.  History reviewed. No pertinent past medical history.  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 No pertinent surgical history.     Home Medications    Prior to Admission medications   Medication Sig Start Date End Date Taking? Authorizing Provider  amoxicillin-clavulanate (AUGMENTIN) 875-125 MG tablet Take 1 tablet by mouth every 12 (twelve) hours. 07/28/23   Raspet, Noberto Retort, PA-C  FLUoxetine (PROZAC) 40 MG capsule Take by mouth. Patient not taking: Reported on 09/21/2020 01/08/18   [provider]  HYDROcodone-acetaminophen (NORCO/VICODIN) 5-325 MG tablet Take 1 tablet by mouth every 8 (eight) hours as needed. 07/29/23  Yes Trevor Iha, FNP  ibuprofen (ADVIL) 800 MG tablet Take 1 tablet (800 mg total) by mouth 3 (three) times daily. 07/28/23   Raspet, Noberto Retort, PA-C  mupirocin ointment (BACTROBAN) 2 % Apply 1 Application topically 2 (two) times daily. 07/28/23   Raspet, Noberto Retort, PA-C  ondansetron (ZOFRAN ODT) 4 MG disintegrating tablet 4mg  ODT every 4-8 hours prn nausea/vomit Patient not  taking: Reported on 09/21/2020 03/27/19   Lurene Shadow, PA-C    Family History Family History  Problem Relation Age of Onset   Osteoporosis Mother    Healthy Father    Healthy Sister    Healthy Brother    Healthy Sister     Social History Social History   Tobacco Use   Smoking status: Some Days   Smokeless tobacco: Never  Vaping Use   Vaping status: Every Day   Substances: Nicotine  Substance Use Topics   Alcohol use: Not Currently   Drug use: Yes    Frequency: 1.0 times per week    Types: Marijuana     Allergies   Diphenhydramine hcl   Review of Systems Review of Systems  Skin:  Positive for wound.  All other systems reviewed and are negative.    Physical Exam Triage Vital Signs ED Triage Vitals [07/29/23 1124]  Encounter Vitals Group     BP      Systolic BP Percentile      Diastolic BP Percentile      Pulse      Resp      Temp      Temp src      SpO2      Weight      Height      Head Circumference      Peak Flow      Pain Score 6     Pain Loc      Pain Education      Exclude from Growth Chart    No data  found.  Updated Vital Signs BP 123/77 (BP Location: Right Arm)   Pulse (!) 102   Temp 99.2 F (37.3 C) (Oral)   Resp 18   SpO2 98%    Physical Exam Vitals and nursing note reviewed.  Constitutional:      Appearance: Normal appearance. He is normal weight.  HENT:     Head: Normocephalic and atraumatic.     Mouth/Throat:     Mouth: Mucous membranes are moist.     Pharynx: Oropharynx is clear.  Eyes:     Extraocular Movements: Extraocular movements intact.     Conjunctiva/sclera: Conjunctivae normal.     Pupils: Pupils are equal, round, and reactive to light.  Cardiovascular:     Rate and Rhythm: Normal rate and regular rhythm.     Pulses: Normal pulses.     Heart sounds: Normal heart sounds.  Pulmonary:     Effort: Pulmonary effort is normal.     Breath sounds: Normal breath sounds. No wheezing, rhonchi or rales.   Musculoskeletal:        General: Normal range of motion.     Cervical back: Normal range of motion and neck supple.  Skin:    General: Skin is warm and dry.     Comments: Left lower leg (inferior lateral aspect) evident mildly erythematous, non-bleeding, healthy appearing puncture wound please see image below  Neurological:     General: No focal deficit present.     Mental Status: He is alert and oriented to person, place, and time. Mental status is at baseline.  Psychiatric:        Mood and Affect: Mood normal.        Behavior: Behavior normal.        Thought Content: Thought content normal.      UC Treatments / Results  Labs (all labs ordered are listed, but only abnormal results are displayed) Labs Reviewed - No data to display  EKG   Radiology No results found.  Procedures Procedures (including critical care time)  Medications Ordered in UC Medications - No data to display  Initial Impression / Assessment and Plan / UC Course  I have reviewed the triage vital signs and the nursing notes.  Pertinent labs & imaging results that were available during my care of the patient were reviewed by me and considered in my medical decision making (see chart for details).     MDM: 1.  Puncture wound of left lower leg, initial encounter-patient evaluated yesterday at urgent care in [facility] for dog bite of left lower leg please see epic for that encounter note patient has not started Augmentin 875/125 mg tablet as of yet.  Rx'd Norco 5/325 mg tablet-patient advised of sedative effects; 2.  Dog bite, subsequent encounter-specific wound care instructions provided to patient and Mother advised wound care follow-up here on Monday, 07/31/2023 or Tuesday, 08/01/2023 of next week. Instructed patient/mother to take medication (Augmentin) as directed with food to completion.  Advised may take Norco daily or as needed for left lower leg pain.  Patient advised of sedative effects of this  medication.  Encouraged to increase daily water intake to 64 ounces per day while taking this medication.  Advised left lower leg wound bandage should be on only temporarily while out otherwise please leave wound open to air to allow to heal by secondary intention/scab formation.  Advised patient to follow-up for wound check on either Monday, 07/31/2023 or Tuesday, 08/01/2023 for wound check.  Work note provided to  patient excusing him for the next upcoming week.  Patient discharged home, hemodynamically stable. Final Clinical Impressions(s) / UC Diagnoses   Final diagnoses:  Puncture wound of left lower leg, initial encounter  Dog bite, subsequent encounter     Discharge Instructions      Instructed patient/Mother to take medication (Augmentin) as directed with food to completion.  Advised may take Norco daily or as needed for left lower leg pain.  Patient advised of sedative effects of this medication.  Encouraged to increase daily water intake to 64 ounces per day while taking this medication.  Advised left lower leg wound bandage should be on only temporarily while out otherwise please leave wound open to air to allow to heal by secondary intention/scab formation.  Advised patient to follow-up for wound check on either Monday, 07/31/2023 or Tuesday, 08/01/2023 for wound check.     ED Prescriptions     Medication Sig Dispense Auth. Provider   HYDROcodone-acetaminophen (NORCO/VICODIN) 5-325 MG tablet Take 1 tablet by mouth every 8 (eight) hours as needed. 21 tablet Trevor Iha, FNP      I have reviewed the PDMP during this encounter.   Trevor Iha, FNP 07/29/23 1245

## 2023-07-29 NOTE — Discharge Instructions (Addendum)
Instructed patient/Mother to take medication (Augmentin) as directed with food to completion.  Advised may take Norco daily or as needed for left lower leg pain.  Patient advised of sedative effects of this medication.  Encouraged to increase daily water intake to 64 ounces per day while taking this medication.  Advised left lower leg wound bandage should be on only temporarily while out otherwise please leave wound open to air to allow to heal by secondary intention/scab formation.  Advised patient to follow-up for wound check on either Monday, 07/31/2023 or Tuesday, 08/01/2023 for wound check.

## 2023-07-31 ENCOUNTER — Ambulatory Visit
Admission: EM | Admit: 2023-07-31 | Discharge: 2023-07-31 | Disposition: A | Discharge status: Home / Self Care | Payer: BC Managed Care – PPO | Attending: Urgent Care | Admitting: Urgent Care

## 2023-07-31 ENCOUNTER — Ambulatory Visit (HOSPITAL_COMMUNITY): Payer: BC Managed Care – PPO

## 2023-07-31 ENCOUNTER — Encounter: Payer: Self-pay | Admitting: Emergency Medicine

## 2023-07-31 DIAGNOSIS — Z23 Encounter for immunization: Secondary | ICD-10-CM

## 2023-07-31 DIAGNOSIS — Z203 Contact with and (suspected) exposure to rabies: Secondary | ICD-10-CM

## 2023-07-31 MED ORDER — RABIES VACCINE, PCEC IM SUSR
1.0000 mL | Freq: Once | INTRAMUSCULAR | Status: AC
  Administered 2023-07-31: 1 mL via INTRAMUSCULAR

## 2023-07-31 NOTE — ED Triage Notes (Signed)
Patient here for his Day 3 rabies vaccine.  No issues with previous series.
